# Patient Record
Sex: Male | Born: 1990 | Race: Black or African American | Hispanic: No | Marital: Single | Smoking: Current every day smoker
Health system: Southern US, Community
[De-identification: ages and names within clinical notes are randomized; demographics above are authoritative.]

---
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
## 2011-04-22 ENCOUNTER — Emergency Department: Payer: Self-pay | Admitting: Emergency Medicine

## 2012-12-28 ENCOUNTER — Emergency Department: Payer: Self-pay | Admitting: Emergency Medicine

## 2012-12-28 LAB — CBC
HCT: 42 % (ref 40.0–52.0)
HGB: 14.2 g/dL (ref 13.0–18.0)
MCH: 29.8 pg (ref 26.0–34.0)
MCV: 88 fL (ref 80–100)
Platelet: 211 10*3/uL (ref 150–440)
RBC: 4.76 10*6/uL (ref 4.40–5.90)
WBC: 8 10*3/uL (ref 3.8–10.6)

## 2012-12-28 LAB — BASIC METABOLIC PANEL
Anion Gap: 4 — ABNORMAL LOW (ref 7–16)
BUN: 11 mg/dL (ref 7–18)
Calcium, Total: 9.1 mg/dL (ref 8.5–10.1)
Chloride: 107 mmol/L (ref 98–107)
Co2: 27 mmol/L (ref 21–32)
EGFR (African American): >60
EGFR (Non-African Amer.): >60
Glucose: 105 mg/dL — ABNORMAL HIGH (ref 65–99)
Potassium: 4.1 mmol/L (ref 3.5–5.1)

## 2014-05-19 ENCOUNTER — Emergency Department: Payer: Self-pay | Admitting: Emergency Medicine

## 2014-05-19 LAB — COMPREHENSIVE METABOLIC PANEL
ALBUMIN: 4 g/dL (ref 3.4–5.0)
ALK PHOS: 61 U/L
ALT: 26 U/L
Anion Gap: 6 — ABNORMAL LOW (ref 7–16)
BUN: 9 mg/dL (ref 7–18)
Bilirubin,Total: 0.6 mg/dL (ref 0.2–1.0)
CHLORIDE: 107 mmol/L (ref 98–107)
Calcium, Total: 9 mg/dL (ref 8.5–10.1)
Co2: 26 mmol/L (ref 21–32)
Creatinine: 1.23 mg/dL (ref 0.60–1.30)
EGFR (African American): >60
EGFR (Non-African Amer.): >60
GLUCOSE: 100 mg/dL — AB (ref 65–99)
Osmolality: 276 (ref 275–301)
Potassium: 4.4 mmol/L (ref 3.5–5.1)
SGOT(AST): 24 U/L (ref 15–37)
Sodium: 139 mmol/L (ref 136–145)
TOTAL PROTEIN: 7.8 g/dL (ref 6.4–8.2)

## 2014-05-19 LAB — CBC
HCT: 43.4 % (ref 40.0–52.0)
HGB: 14 g/dL (ref 13.0–18.0)
MCH: 29.7 pg (ref 26.0–34.0)
MCHC: 32.2 g/dL (ref 32.0–36.0)
MCV: 92 fL (ref 80–100)
Platelet: 233 10*3/uL (ref 150–440)
RBC: 4.7 10*6/uL (ref 4.40–5.90)
RDW: 13.4 % (ref 11.5–14.5)
WBC: 14 10*3/uL — ABNORMAL HIGH (ref 3.8–10.6)

## 2014-05-19 LAB — URINALYSIS, COMPLETE
BACTERIA: NONE SEEN
BILIRUBIN, UR: NEGATIVE
Glucose,UR: NEGATIVE mg/dL (ref 0–75)
Leukocyte Esterase: NEGATIVE
Nitrite: NEGATIVE
Ph: 7 (ref 4.5–8.0)
Protein: 30
RBC,UR: 89 /HPF (ref 0–5)
SQUAMOUS EPITHELIAL: NONE SEEN
Specific Gravity: 1.028 (ref 1.003–1.030)

## 2014-08-14 ENCOUNTER — Emergency Department: Payer: Self-pay | Admitting: Emergency Medicine

## 2014-08-21 ENCOUNTER — Emergency Department: Payer: Self-pay | Admitting: Emergency Medicine

## 2014-09-11 ENCOUNTER — Emergency Department
Admit: 2014-09-11 | Disposition: A | Discharge status: Home / Self Care | Payer: Self-pay | Admitting: Emergency Medicine

## 2014-09-27 ENCOUNTER — Emergency Department
Admit: 2014-09-27 | Disposition: A | Discharge status: Home / Self Care | Payer: Self-pay | Admitting: Emergency Medicine

## 2014-10-03 ENCOUNTER — Emergency Department: Admit: 2014-10-03 | Disposition: A | Discharge status: Home / Self Care | Payer: Self-pay | Admitting: Internal Medicine

## 2014-10-16 ENCOUNTER — Emergency Department
Admission: EM | Admit: 2014-10-16 | Discharge: 2014-10-16 | Disposition: A | Discharge status: Home / Self Care | Payer: Self-pay | Attending: Emergency Medicine | Admitting: Emergency Medicine

## 2014-10-16 ENCOUNTER — Emergency Department: Payer: Self-pay

## 2014-10-16 ENCOUNTER — Encounter: Payer: Self-pay | Admitting: Emergency Medicine

## 2014-10-16 DIAGNOSIS — Z720 Tobacco use: Secondary | ICD-10-CM | POA: Insufficient documentation

## 2014-10-16 DIAGNOSIS — R252 Cramp and spasm: Secondary | ICD-10-CM | POA: Insufficient documentation

## 2014-10-16 DIAGNOSIS — R202 Paresthesia of skin: Secondary | ICD-10-CM | POA: Insufficient documentation

## 2014-10-16 MED ORDER — PREDNISONE 10 MG (21) PO TBPK: ORAL_TABLET | ORAL | Status: DC

## 2014-10-16 MED ORDER — CYCLOBENZAPRINE HCL 10 MG PO TABS: 10.0000 mg | ORAL_TABLET | Freq: Three times a day (TID) | ORAL | Status: AC | PRN

## 2014-10-16 MED ORDER — PREDNISONE 20 MG PO TABS: 60.0000 mg | ORAL_TABLET | Freq: Once | ORAL | Status: DC

## 2014-10-16 MED ORDER — TRAMADOL HCL 50 MG PO TABS: 50.0000 mg | ORAL_TABLET | Freq: Four times a day (QID) | ORAL | Status: AC | PRN

## 2014-10-16 MED ORDER — DIAZEPAM 5 MG PO TABS
5.0000 mg | ORAL_TABLET | Freq: Once | ORAL | Status: AC
  Administered 2014-10-16: 5 mg via ORAL

## 2014-10-16 MED ORDER — PREDNISONE 20 MG PO TABS
ORAL_TABLET | ORAL | Status: AC
  Administered 2014-10-16: 60 mg
  Filled 2014-10-16: qty 2

## 2014-10-16 MED ORDER — DIAZEPAM 5 MG PO TABS
ORAL_TABLET | ORAL | Status: AC
  Administered 2014-10-16: 5 mg via ORAL
  Filled 2014-10-16: qty 1

## 2014-10-16 NOTE — ED Provider Notes (Signed)
[facility] Emergency Department Provider Note  ____________________________________________  Time seen: Approximately 1500 I have reviewed the triage vital signs and the nursing notes.   HISTORY  Chief Complaint Shoulder Pain   Historian Patient    HPI James Rodriguez is a 24 y.o. male complaining of multiple months of right shoulder pain with intermittent numbness shooting down his arm as he has not injured himself in any way that he can think of says his pain comes and goes nothing particularly making it better or worse currently rates it as about 8 out of 10 with any type of movement or pushing on the area denies any loss of strength or sensation no other associated signs or symptoms no chest pain shortness of breath headache blurred vision disorientation I anything other previously noted in history of present illness   History reviewed. No pertinent past medical history.    There are no active problems to display for this patient.   History reviewed. No pertinent past surgical history.  Current Outpatient Rx  Name  Route  Sig  Dispense  Refill  . cyclobenzaprine (FLEXERIL) 10 MG tablet   Oral   Take 1 tablet (10 mg total) by mouth every 8 (eight) hours as needed for muscle spasms.   30 tablet   1   . predniSONE (STERAPRED UNI-PAK 21 TAB) 10 MG (21) TBPK tablet      Take 6 tablets on day 1 Take 5 tablets on day 2 Take 4 tablets on day 3 Take 3 tablets on day 4 Take 2 tablets on day 5 Take 1 tablet on day 6   21 tablet   0   . traMADol (ULTRAM) 50 MG tablet   Oral   Take 1 tablet (50 mg total) by mouth every 6 (six) hours as needed for moderate pain.   12 tablet   0     Allergies Review of patient's allergies indicates no known allergies.  No family history on file.  Social History History  Substance Use Topics  . Smoking status: Current Every Day Smoker -- 0.50 packs/day    Types: Cigarettes  . Smokeless tobacco: Not on  file  . Alcohol Use: Yes     Comment: occasionally    Review of Systems Constitutional: No fever.  Baseline level of activity. Eyes: No visual changes.  No red eyes/discharge. ENT: No sore throat.  Not pulling at ears. Cardiovascular: Negative for chest pain/palpitations. Respiratory: Negative for shortness of breath. Gastrointestinal: No abdominal pain.  No nausea, no vomiting.  No diarrhea.  No constipation. Genitourinary: Negative for dysuria.  Normal urination. Musculoskeletal: Negative for back pain. Skin: Negative for rash. Neurological: Negative for headaches, focal weakness or numbness.  10-point ROS otherwise negative.  ____________________________________________   PHYSICAL EXAM:  VITAL SIGNS: ED Triage Vitals  Enc Vitals Group     BP 10/16/14 1206 126/88 mmHg     Pulse Rate 10/16/14 1206 64     Resp 10/16/14 1206 18     Temp 10/16/14 1206 98.1 F (36.7 C)     Temp Source 10/16/14 1206 Oral     SpO2 10/16/14 1206 100 %     Weight 10/16/14 1206 265 lb (120.203 kg)     Height 10/16/14 1206 5\' 11"  (1.803 m)     Head Cir --      Peak Flow --      Pain Score 10/16/14 1208 9     Pain Loc --  Pain Edu? --      Excl. in GC? --     Constitutional: Alert, attentive, and oriented appropriately for age. Well appearing and in no acute distress.  Eyes: Conjunctivae are normal. PERRL. EOMI. Head: Atraumatic and normocephalic. Nose: No congestion/rhinnorhea. Mouth/Throat: Mucous membranes are moist.  Oropharynx non-erythematous. Neck: No stridor. No cervical spine tenderness to palpation. Cardiovascular: Normal rate, regular rhythm. Grossly normal heart sounds.  Good peripheral circulation with normal cap refill. Respiratory: Normal respiratory effort.  No retractions. Lungs CTAB with no W/R/R. Musculoskeletal: Non-tender with normal range of motion in all extremities.  No joint effusions.  Weight-bearing without difficulty. 5 strength equal symmetrical upper  extremities bilaterally no functional deficit full range of motion tight spasm muscles along the trapezius deltoid and latissimus on the right side Neurologic: . No gross focal neurologic deficits are appreciated.  No gait instability. Speech is normal.  Skin:  Skin is warm, dry and intact. No rash noted. Psychiatric: Mood and affect are normal. Speech and behavior are normal.   ____________________________________________    RADIOLOGY  X-rays of the patient C-spine is negative ____________________________________________   PROCEDURES  Procedure(s) performed: None  Critical Care performed: No  ____________________________________________   INITIAL IMPRESSION / ASSESSMENT AND PLAN / ED COURSE  Pertinent labs & imaging results that were available during my care of the patient were reviewed by me and considered in my medical decision making (see chart for details).  She'll impression on this patient right shoulder strain muscle spasm paresthesia start the patient on steroids and muscle relaxers and have the patient follow-up with orthopedics since this is been bothering him for multiple months advised rest ice massage and stretch to the area hernia for any acute concerns or worsening symptoms ____________________________________________   FINAL CLINICAL IMPRESSION(S) / ED DIAGNOSES  Final diagnoses:  Muscle cramps  Paresthesia of right arm     Madicyn Mesina Rosalyn GessWilliam C January Bergthold, PA-C 10/16/14 1611  Sharyn CreamerMark Quale, MD 10/21/14 1700

## 2014-10-16 NOTE — ED Notes (Signed)
Pt c/o right shoulder pain for the past couple of months

## 2014-10-16 NOTE — ED Notes (Signed)
ICE PACK GIVEN

## 2017-02-18 IMAGING — CR DG SHOULDER 3+V*R*
1 series · 3 of 3 positions shown · non-contrast
Comparison: None.

CLINICAL DATA: Right shoulder pain for 4 months, no known injury,
initial encounter.

EXAM:
DG SHOULDER 3+ VIEWS RIGHT

[Series 1: dxr shoulder right complete · 0.14mm/px · 3 of 3 slices shown]
[im 1/3]
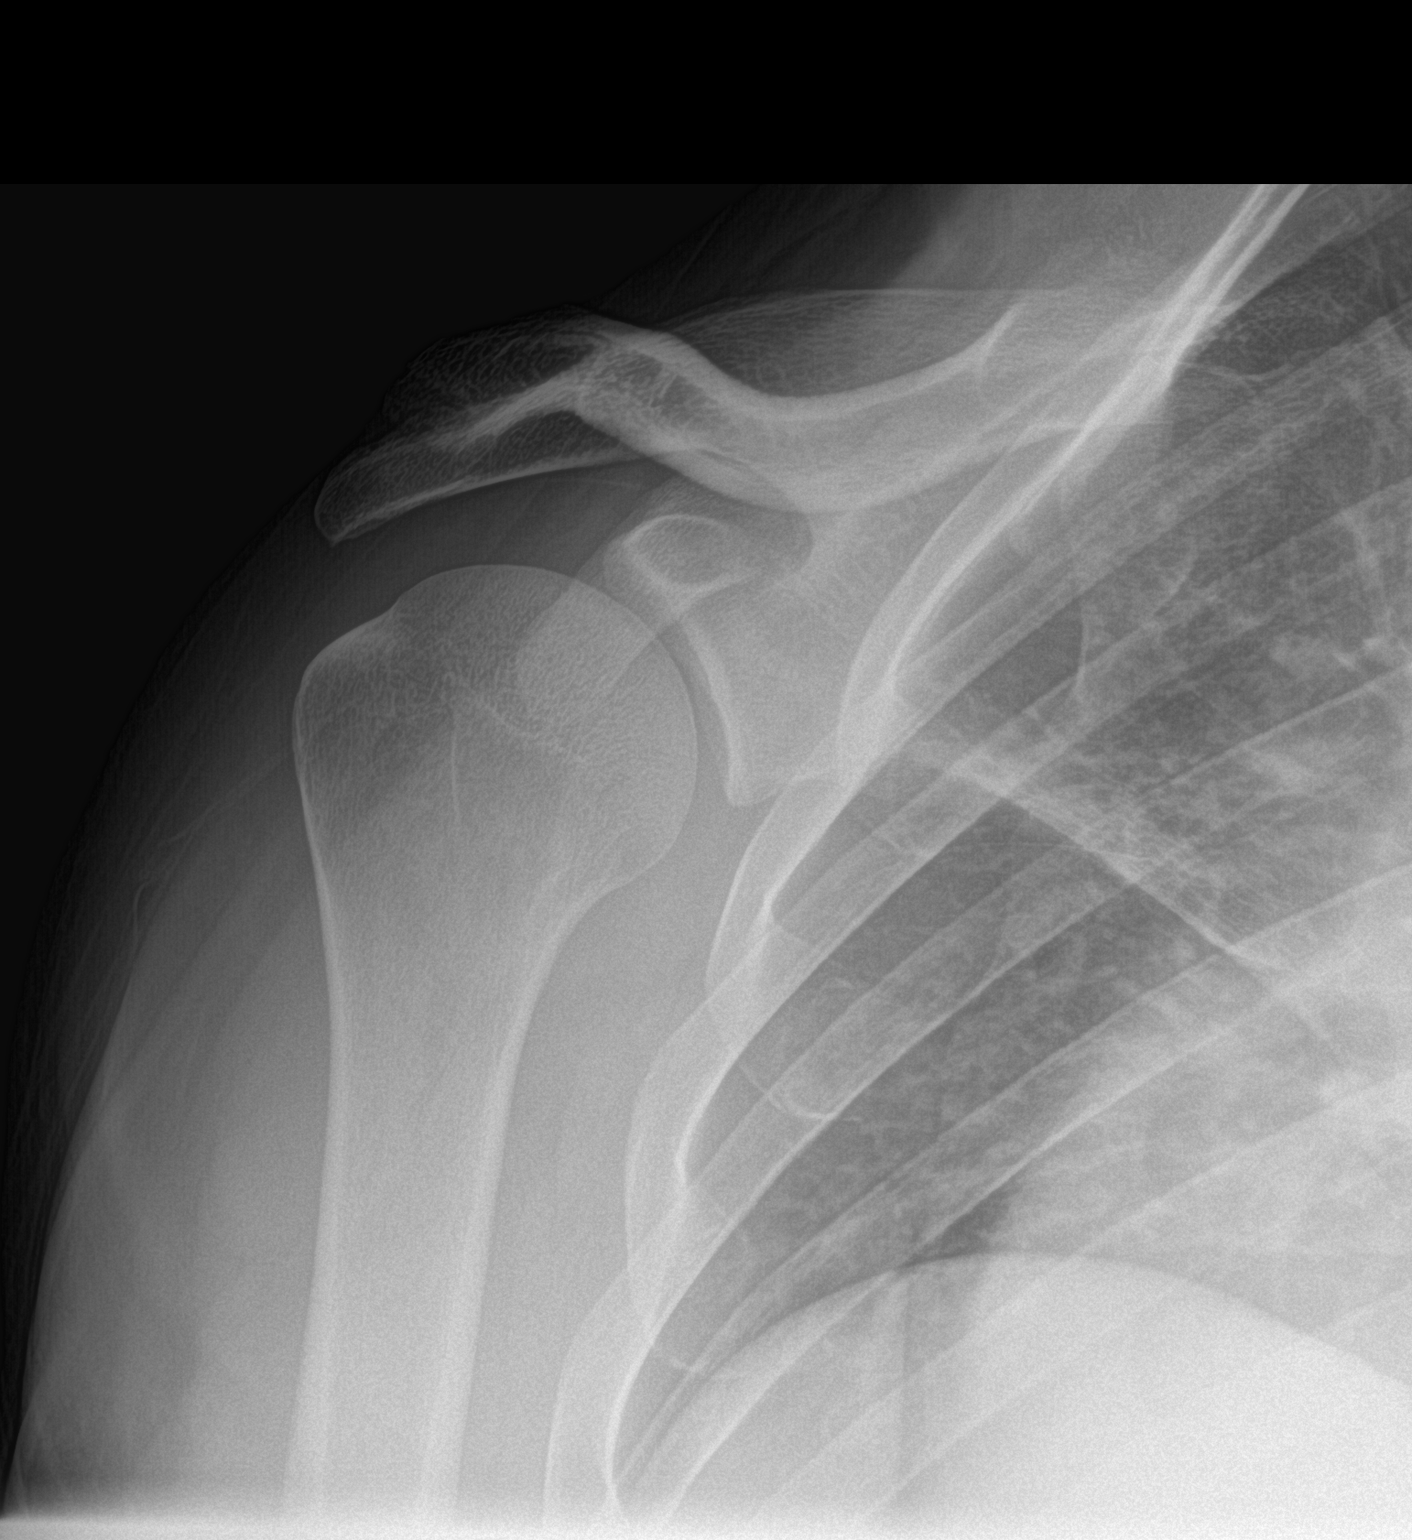
[im 2/3]
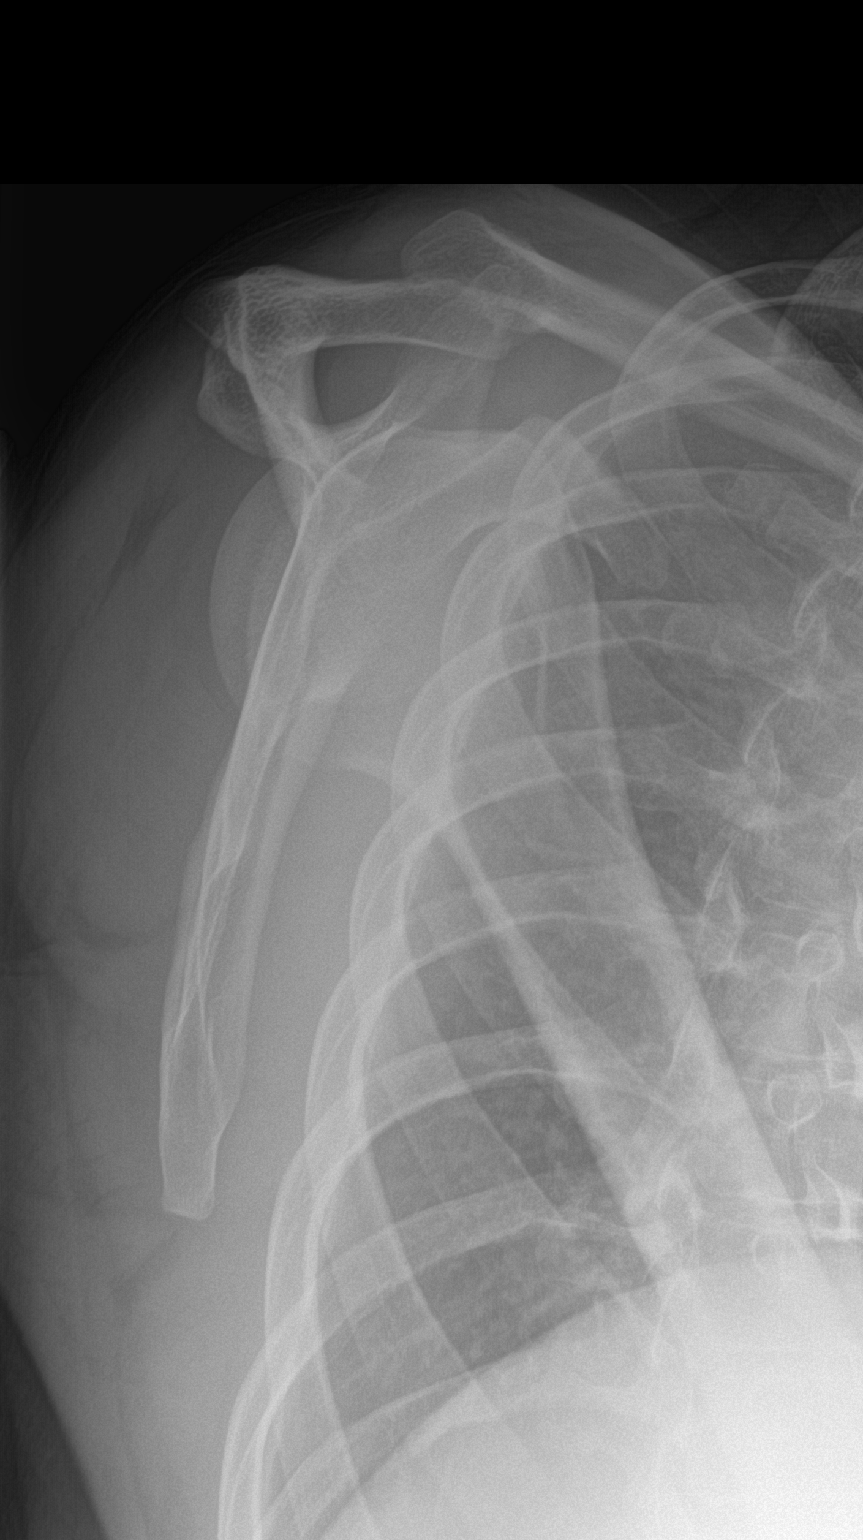
[im 3/3]
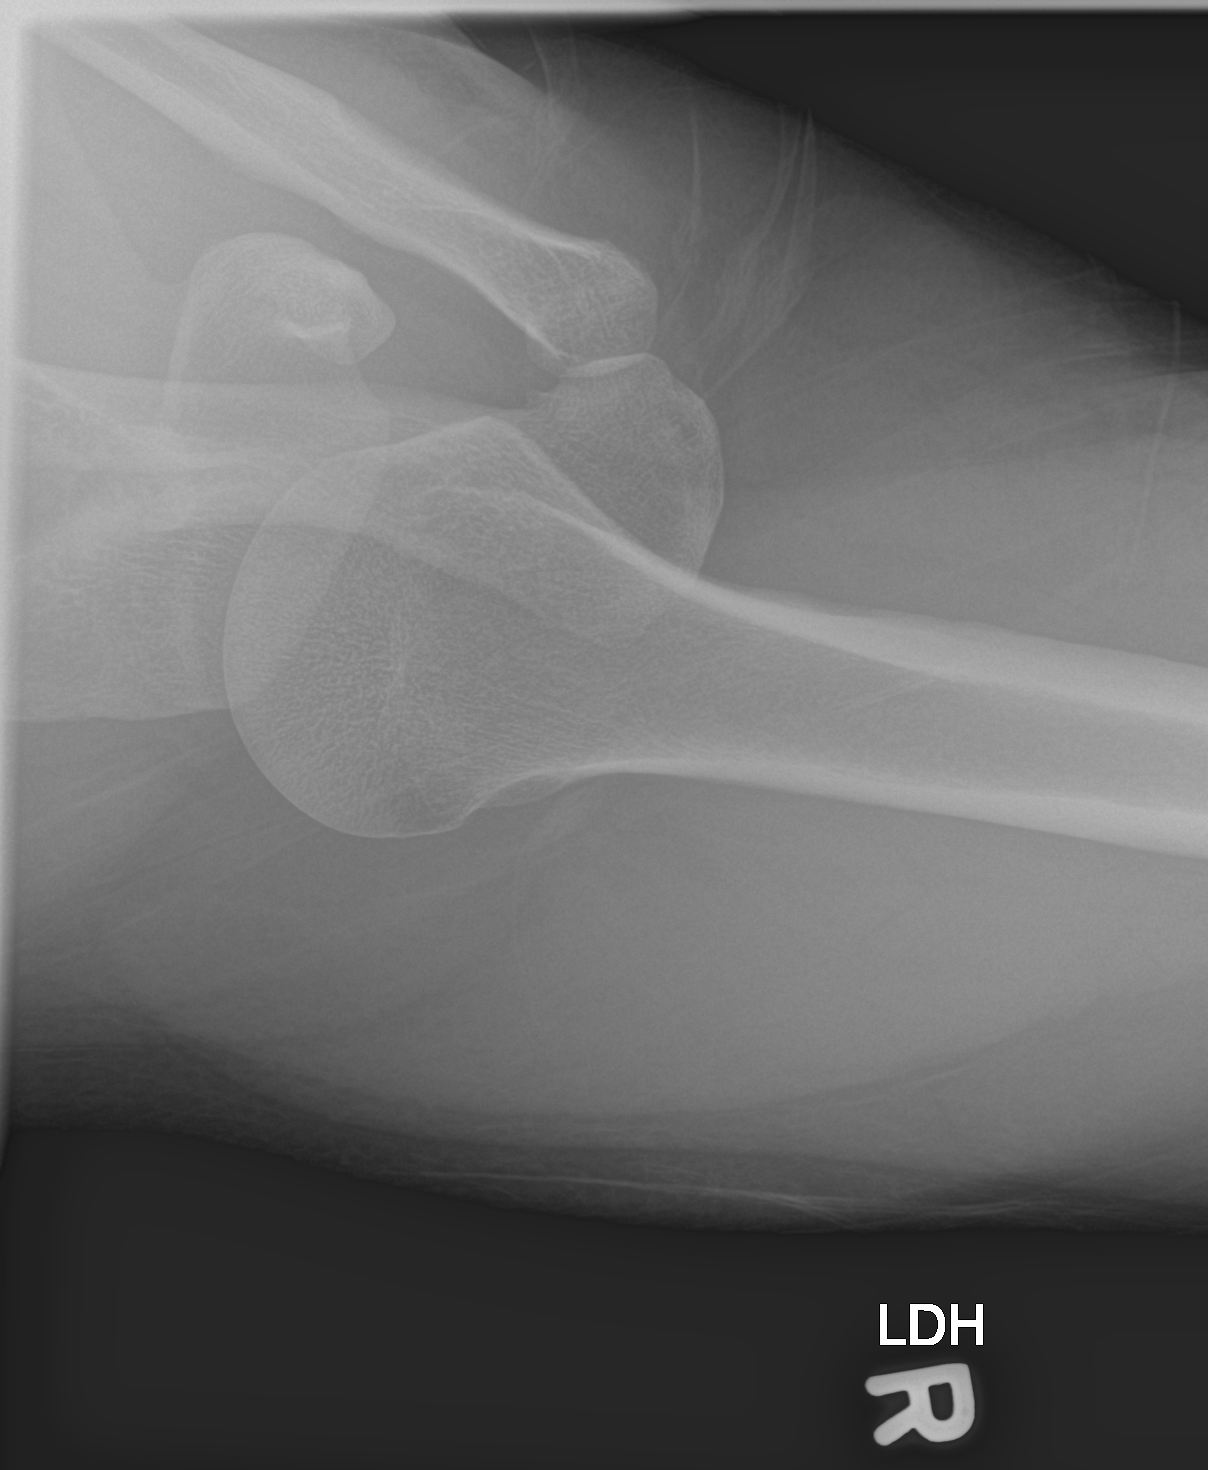

[3 of 3 positions shown; findings below may reference images not displayed]

FINDINGS: No acute osseous or joint abnormality. No degenerative changes.
Visualized portion of the right chest is unremarkable.
IMPRESSION: Negative.

## 2018-03-23 ENCOUNTER — Emergency Department
Admission: EM | Admit: 2018-03-23 | Discharge: 2018-03-23 | Disposition: A | Discharge status: Home / Self Care | Payer: Self-pay | Attending: Emergency Medicine | Admitting: Emergency Medicine

## 2018-03-23 ENCOUNTER — Other Ambulatory Visit: Payer: Self-pay

## 2018-03-23 ENCOUNTER — Encounter: Payer: Self-pay | Admitting: Emergency Medicine

## 2018-03-23 DIAGNOSIS — R51 Headache: Secondary | ICD-10-CM | POA: Insufficient documentation

## 2018-03-23 DIAGNOSIS — F17210 Nicotine dependence, cigarettes, uncomplicated: Secondary | ICD-10-CM | POA: Insufficient documentation

## 2018-03-23 DIAGNOSIS — R519 Headache, unspecified: Secondary | ICD-10-CM

## 2018-03-23 DIAGNOSIS — K029 Dental caries, unspecified: Secondary | ICD-10-CM | POA: Insufficient documentation

## 2018-03-23 MED ORDER — AMOXICILLIN 500 MG PO CAPS: 500.0000 mg | ORAL_CAPSULE | Freq: Three times a day (TID) | ORAL | 0 refills | Status: DC

## 2018-03-23 MED ORDER — IBUPROFEN 600 MG PO TABS: 600.0000 mg | ORAL_TABLET | Freq: Four times a day (QID) | ORAL | 0 refills | Status: DC | PRN

## 2018-03-23 MED ORDER — LIDOCAINE VISCOUS HCL 2 % MT SOLN: 10.0000 mL | OROMUCOSAL | 0 refills | Status: DC | PRN

## 2018-03-23 NOTE — ED Notes (Signed)
See triage note  Present with dental pain  Possible dental abscess   States he has had dental pain for about 2months

## 2018-03-23 NOTE — ED Triage Notes (Signed)
PT to ED , c/o dental pain x33months and states causing migraine. Pt A&OX4, NAD noted. Appears uncomfortable

## 2018-03-23 NOTE — Discharge Instructions (Addendum)
OPTIONS FOR DENTAL FOLLOW UP CARE ° °[place] [facility] and [facility] - Local Safety Net Dental Clinics °http://www.ncdhhs.gov/dph/oralhealth/services/safetynetclinics.htm °  °[facility] (336-562-3123) ° °[facility] [place] ([phone]) ° °[facility] ([phone] ext [phone]) ° °[facility] ([phone]) ° °[facility] ([phone]) °This clinic caters to the indigent population and is on a lottery system. °Location: °[facility], [address], [place] °Clinic Hours: °Wednesdays from 6pm - 9pm, patients seen by a lottery system. °For dates, call or go to www.med.[facility].edu/[facility]/patients/Dental-[facility] °Services: °Cleanings, fillings and simple extractions. °Payment Options: °DENTAL WORK IS FREE OF CHARGE. Bring proof of income or support. °Best way to get seen: °Arrive at 5:15 pm - this is a lottery, NOT first come/first serve, so arriving earlier will not increase your chances of being seen. °  °  °[facility] °[phone] °Select option 1 for emergencies °  °Location: °[facility], [address], [place] °Clinic Hours: °No walk-ins accepted - call the day before to schedule an appointment. °Check in times are 9:30 am and 1:30 pm. °Services: °Simple extractions, temporary fillings, pulpectomy/pulp debridement, uncomplicated abscess drainage. °Payment Options: °PAYMENT IS DUE AT THE TIME OF SERVICE.  Fee is usually $100-200, additional surgical procedures (e.g. abscess drainage) may be extra. °Cash, checks, Visa/MasterCard accepted.  Can file Medicaid if patient is covered for dental - patient should call case worker to check. °No discount for [facility] patients. °Best way to get seen: °MUST call the day before and get onto the schedule. Can usually be seen the next 1-2 days. No walk-ins accepted. °  °  °[facility] °[phone] °   °Location: °[facility], [address], [place] °Clinic Hours: °M, W, Th, F 8am or 1:30pm, Tues 9a or 1:30 - first come/first served. °Services: °Simple extractions, temporary fillings, uncomplicated abscess drainage.  You do not need to be an [place] resident. °Payment Options: °PAYMENT IS DUE AT THE TIME OF SERVICE. °Dental insurance, otherwise sliding scale - bring proof of income or support. °Depending on income and treatment needed, cost is usually $50-200. °Best way to get seen: °Arrive early as it is first come/first served. °  °  °[facility] °[phone] °  °Location: °[address] °Clinic Hours: °Mon-Thu 8a-5p °Services: °Most basic dental services including extractions and fillings. °Payment Options: °PAYMENT IS DUE AT THE TIME OF SERVICE. °Sliding scale, up to 50% off - bring proof if income or support. °Medicaid with dental option accepted. °Best way to get seen: °Call to schedule an appointment, can usually be seen within 2 weeks OR they will try to see walk-ins - show up at 8a or 2p (you may have to wait). °  °  °[facility] °[phone] °[place] RESIDENTS ONLY °  °Location: °[facility], [address], [place], [place] [address] °Clinic Hours: By appointment only. °Monday - Thursday 8am-5pm, Friday 8am-12pm °Services: Cleanings, fillings, extractions. °Payment Options: °PAYMENT IS DUE AT THE TIME OF SERVICE. °Cash, Visa or MasterCard. Sliding scale - $30 minimum per service. °Best way to get seen: °Come in to office, complete packet and make an appointment - need proof of income °or support monies for each household member and proof of [place] residence. °Usually takes about a month to get in. °  °  °[facility] °[phone] °  °Location: °[address]   [place] °Clinic Hours: Walk-in Urgent Care Dental Services are offered Monday-Friday  mornings only. °The numbers of emergencies accepted daily is limited to the number of °providers available. °Maximum 15 - Mondays, Wednesdays & Thursdays °Maximum 10 - Tuesdays & Fridays °Services: °You do not need to be a [place] resident to be seen for a dental emergency. °Emergencies are defined as pain, swelling, abnormal bleeding, or dental trauma. Walkins will receive x-rays if needed. °NOTE: Dental cleaning is not an emergency. °Payment Options: °PAYMENT IS DUE AT THE TIME OF SERVICE. °Minimum co-pay is $40.00 for uninsured patients. °Minimum co-pay is $3.00 for Medicaid with dental coverage. °Dental Insurance is accepted and must be presented at time of visit. °Medicare does not cover dental. °Forms of payment: Cash, credit card, checks. °Best way to get seen: °If not previously registered with the clinic, walk-in dental registration begins at 7:15 am and is on a first come/first serve basis. °If previously registered with the clinic, call to make an appointment. °  °  °[facility] °[phone] °[place] RESIDENTS ONLY °  °Location: °[address], [place], [place] °Clinic Hours: °Mon-Thu 10a-2p °Services: Extractions only! °Payment Options: °FREE (donations accepted) - bring proof of income or support °Best way to get seen: °Call and schedule an appointment OR come at 8am on the 1st Monday of every month (except for holidays) when it is first come/first served. °  °  °[facility] °[phone] °  °Location: °[address], [place] °Clinic Hours: °Friday mornings °Services, Payment Options, Best way to get seen: °Call for info °

## 2018-03-23 NOTE — ED Provider Notes (Signed)
[facility] Emergency Department Provider Note  ____________________________________________  Time seen: Approximately 7:42 AM  I have reviewed the triage vital signs and the nursing notes.   HISTORY  Chief Complaint Dental Pain and Headache    HPI James Rodriguez is a 27 y.o. male that presents emergency department for evaluation of right-sided dental pain and headache for 2 months.  Patient states that headache worsens when dental pain worsens and improves when dental pain improves.  Headache is primarily on the right side.  Headache resolved with ibuprofen.  Headache has not changed in character in the last 2 months.  Patient woke up this morning with worsening dental pain so he came to the emergency department.  He does not have a dentist and has not seen a dentist in years.  No fever, chills, nausea, vomiting.  History reviewed. No pertinent past medical history.  There are no active problems to display for this patient.   History reviewed. No pertinent surgical history.  Prior to Admission medications   Medication Sig Start Date End Date Taking? Authorizing Provider  amoxicillin (AMOXIL) 500 MG capsule Take 1 capsule (500 mg total) by mouth 3 (three) times daily. 03/23/18   Enid Derry, PA-C  ibuprofen (ADVIL,MOTRIN) 600 MG tablet Take 1 tablet (600 mg total) by mouth every 6 (six) hours as needed. 03/23/18   Enid Derry, PA-C  lidocaine (XYLOCAINE) 2 % solution Use as directed 10 mLs in the mouth or throat as needed for mouth pain. 03/23/18   Enid Derry, PA-C  predniSONE (STERAPRED UNI-PAK 21 TAB) 10 MG (21) TBPK tablet Take 6 tablets on day 1 Take 5 tablets on day 2 Take 4 tablets on day 3 Take 3 tablets on day 4 Take 2 tablets on day 5 Take 1 tablet on day 6 10/16/14   Ruffian, III Kristine Garbe, PA-C    Allergies Patient has no known allergies.  No family history on file.  Social History Social History   Tobacco Use  . Smoking  status: Current Every Day Smoker    Packs/day: 0.50    Types: Cigarettes  . Smokeless tobacco: Never Used  Substance Use Topics  . Alcohol use: Yes    Comment: occasionally  . Drug use: No     Review of Systems  Constitutional: No fever/chills Cardiovascular: No chest pain. Respiratory: No SOB. Gastrointestinal: No abdominal pain.  No nausea, no vomiting.  Musculoskeletal: Negative for musculoskeletal pain. Skin: Negative for rash, abrasions, lacerations, ecchymosis. Neurological: Negative for numbness or tingling.  Positive for headache.   ____________________________________________   PHYSICAL EXAM:  VITAL SIGNS: ED Triage Vitals  Enc Vitals Group     BP 03/23/18 0709 (!) 160/90     Pulse Rate 03/23/18 0709 67     Resp 03/23/18 0709 16     Temp 03/23/18 0709 98.8 F (37.1 C)     Temp Source 03/23/18 0709 Oral     SpO2 03/23/18 0709 98 %     Weight --      Height --      Head Circumference --      Peak Flow --      Pain Score 03/23/18 0708 10     Pain Loc --      Pain Edu? --      Excl. in GC? --      Constitutional: Alert and oriented. Well appearing and in no acute distress. Eyes: Conjunctivae are normal. PERRL. EOMI. Head: Atraumatic. ENT:  Ears:       Nose: No congestion/rhinnorhea.      Mouth/Throat: Mucous membranes are moist.  Right dental premolar fracture.  Tenderness to palpation surrounding right bottom back molar.  No swelling.  No drainage.  No difficulty opening or closing mouth. Neck: No stridor.   Cardiovascular: Normal rate, regular rhythm.  Good peripheral circulation. Respiratory: Normal respiratory effort without tachypnea or retractions. Lungs CTAB. Good air entry to the bases with no decreased or absent breath sounds. Musculoskeletal: Full range of motion to all extremities. No gross deformities appreciated. Neurologic:  Normal speech and language. No gross focal neurologic deficits are appreciated.  Skin:  Skin is warm, dry and  intact. No rash noted. Psychiatric: Mood and affect are normal. Speech and behavior are normal. Patient exhibits appropriate insight and judgement.   ____________________________________________   LABS (all labs ordered are listed, but only abnormal results are displayed)  Labs Reviewed - No data to display ____________________________________________  EKG   ____________________________________________  RADIOLOGY  No results found.  ____________________________________________    PROCEDURES  Procedure(s) performed:    Procedures    Medications - No data to display   ____________________________________________   INITIAL IMPRESSION / ASSESSMENT AND PLAN / ED COURSE  Pertinent labs & imaging results that were available during my care of the patient were reviewed by me and considered in my medical decision making (see chart for details).  Review of the [place] CSRS was performed in accordance of the [facility] prior to dispensing any controlled drugs.     Patient presented to the department for evaluation of dental pain.  Vital signs and exam are reassuring.  Patient states that headache is directly related to dental pain.  Headache has not changed in character today and resolves with ibuprofen.  We discussed additional work-up and patient is agreeable to begin antibiotics and follow-up with dentist or return to the emergency department for worsening or changing symptoms.  Patient will be discharged home with prescriptions for amoxicillin, viscous lidocaine, ibuprofen. Patient is to follow up with dentist as directed.  Dental resources were given.  Patient is given ED precautions to return to the ED for any worsening or new symptoms.     ____________________________________________  FINAL CLINICAL IMPRESSION(S) / ED DIAGNOSES  Final diagnoses:  Pain due to dental caries  Acute intractable headache, unspecified headache type      NEW MEDICATIONS STARTED DURING THIS  VISIT:  ED Discharge Orders         Ordered    amoxicillin (AMOXIL) 500 MG capsule  3 times daily     03/23/18 0740    ibuprofen (ADVIL,MOTRIN) 600 MG tablet  Every 6 hours PRN     03/23/18 0740    lidocaine (XYLOCAINE) 2 % solution  As needed     03/23/18 0740              This chart was dictated using voice recognition software/Dragon. Despite best efforts to proofread, errors can occur which can change the meaning. Any change was purely unintentional.    Enid Derry, PA-C 03/23/18 1502    Emily Filbert, MD 03/24/18 [phone]

## 2020-02-18 ENCOUNTER — Emergency Department: Payer: Self-pay

## 2020-02-18 ENCOUNTER — Other Ambulatory Visit: Payer: Self-pay

## 2020-02-18 ENCOUNTER — Emergency Department
Admission: EM | Admit: 2020-02-18 | Discharge: 2020-02-18 | Disposition: A | Discharge status: Home / Self Care | Payer: Self-pay | Attending: Emergency Medicine | Admitting: Emergency Medicine

## 2020-02-18 DIAGNOSIS — T07XXXA Unspecified multiple injuries, initial encounter: Secondary | ICD-10-CM

## 2020-02-18 DIAGNOSIS — Z23 Encounter for immunization: Secondary | ICD-10-CM | POA: Insufficient documentation

## 2020-02-18 DIAGNOSIS — Z79899 Other long term (current) drug therapy: Secondary | ICD-10-CM | POA: Insufficient documentation

## 2020-02-18 DIAGNOSIS — Y929 Unspecified place or not applicable: Secondary | ICD-10-CM | POA: Insufficient documentation

## 2020-02-18 DIAGNOSIS — S61412A Laceration without foreign body of left hand, initial encounter: Secondary | ICD-10-CM

## 2020-02-18 DIAGNOSIS — F17210 Nicotine dependence, cigarettes, uncomplicated: Secondary | ICD-10-CM | POA: Insufficient documentation

## 2020-02-18 DIAGNOSIS — S61432A Puncture wound without foreign body of left hand, initial encounter: Secondary | ICD-10-CM | POA: Insufficient documentation

## 2020-02-18 DIAGNOSIS — Y939 Activity, unspecified: Secondary | ICD-10-CM | POA: Insufficient documentation

## 2020-02-18 DIAGNOSIS — Y999 Unspecified external cause status: Secondary | ICD-10-CM | POA: Insufficient documentation

## 2020-02-18 MED ORDER — LIDOCAINE HCL (PF) 1 % IJ SOLN: 30.0000 mL | Freq: Once | INTRAMUSCULAR | Status: DC

## 2020-02-18 MED ORDER — TETANUS-DIPHTH-ACELL PERTUSSIS 5-2.5-18.5 LF-MCG/0.5 IM SUSP
0.5000 mL | Freq: Once | INTRAMUSCULAR | Status: AC
  Administered 2020-02-18: 0.5 mL via INTRAMUSCULAR
  Filled 2020-02-18: qty 0.5

## 2020-02-18 MED ORDER — CEPHALEXIN 500 MG PO CAPS
500.0000 mg | ORAL_CAPSULE | Freq: Once | ORAL | Status: AC
Start: 2020-02-18 — End: 2020-02-18
  Administered 2020-02-18: 500 mg via ORAL
  Filled 2020-02-18: qty 1

## 2020-02-18 MED ORDER — LIDOCAINE HCL (PF) 1 % IJ SOLN
INTRAMUSCULAR | Status: AC
  Filled 2020-02-18: qty 10

## 2020-02-18 MED ORDER — CEPHALEXIN 500 MG PO CAPS: 500.0000 mg | ORAL_CAPSULE | Freq: Four times a day (QID) | ORAL | 0 refills | Status: AC
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
## 2020-09-04 ENCOUNTER — Emergency Department
Admission: EM | Admit: 2020-09-04 | Discharge: 2020-09-04 | Disposition: A | Discharge status: Home / Self Care | Payer: Self-pay | Attending: Emergency Medicine | Admitting: Emergency Medicine

## 2020-09-04 ENCOUNTER — Encounter: Payer: Self-pay | Admitting: Emergency Medicine

## 2020-09-04 ENCOUNTER — Other Ambulatory Visit: Payer: Self-pay

## 2020-09-04 ENCOUNTER — Emergency Department: Payer: Self-pay

## 2020-09-04 DIAGNOSIS — X500XXA Overexertion from strenuous movement or load, initial encounter: Secondary | ICD-10-CM | POA: Insufficient documentation

## 2020-09-04 DIAGNOSIS — M4722 Other spondylosis with radiculopathy, cervical region: Secondary | ICD-10-CM | POA: Insufficient documentation

## 2020-09-04 DIAGNOSIS — F17210 Nicotine dependence, cigarettes, uncomplicated: Secondary | ICD-10-CM | POA: Insufficient documentation

## 2020-09-04 MED ORDER — PREDNISONE 10 MG PO TABS: ORAL_TABLET | ORAL | 0 refills | Status: DC

## 2020-09-04 MED ORDER — KETOROLAC TROMETHAMINE 30 MG/ML IJ SOLN
30.0000 mg | Freq: Once | INTRAMUSCULAR | Status: AC
  Administered 2020-09-04: 30 mg via INTRAMUSCULAR
  Filled 2020-09-04: qty 1

## 2020-09-04 NOTE — ED Triage Notes (Signed)
Pt c/o pain to L shoulder, pt states pain starts in L hand and then radiates up through L arm to his neck. Pt states pain has been ongoing for last 15 days. 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 Pt states pain localized to L 3rd digit at this time then radiates up his L arm. States pain starts throbbing then is numb, states numbness is straight line from middle finger up his shoulder. Pt states pain worse with movement or with lifting.

## 2020-09-04 NOTE — Discharge Instructions (Signed)
Follow-up with your primary care provider if any continued problems and also if a referral for physical therapy is needed.  A prescription for prednisone was sent to your pharmacy.  You may take Tylenol with this medication if additional pain medication is needed.  Use ice or heat to your neck as needed for discomfort.  Also have your blood pressure rechecked at your primary care provider as it was elevated while in the emergency department today.

## 2020-09-04 NOTE — ED Notes (Signed)
See triage note  Presents with left shoulder pain  Denies any injury  States pain goes from neck at times into left shoulder and arm  No deformity noted   Good pulses

## 2020-09-04 NOTE — ED Provider Notes (Signed)
[facility] Emergency Department Provider Note  ____________________________________________   Event Date/Time   First MD Initiated Contact with Patient 09/04/20 [phone]     (approximate)  I have reviewed the triage vital signs and the nursing notes.   HISTORY  Chief Complaint Shoulder Pain   HPI James Rodriguez is a 30 y.o. male Modena Jansky to the ED with complaint of left shoulder pain that radiates down into his left hand.  Patient states that there was no specific injury but that he lifts a lot of heavy boxes.  He also states that there is pain/numbness in his left third digit at times.  He has taken over-the-counter ibuprofen without any relief.  Patient denies any previous injury to his neck.  Currently rates his pain as an 8 out of 10.       History reviewed. No pertinent past medical history.  There are no problems to display for this patient.   History reviewed. No pertinent surgical history.  Prior to Admission medications   Medication Sig Start Date End Date Taking? Authorizing Provider  predniSONE (DELTASONE) 10 MG tablet Take 6 tablets  today, on day 2 take 5 tablets, day 3 take 4 tablets, day 4 take 3 tablets, day 5 take  2 tablets and 1 tablet the last day 09/04/20  Yes Tommi Rumps, PA-C    Allergies Patient has no known allergies.  History reviewed. No pertinent family history.  Social History Social History   Tobacco Use  . Smoking status: Current Every Day Smoker    Packs/day: 0.50    Types: Cigarettes  . Smokeless tobacco: Never Used  Substance Use Topics  . Alcohol use: Yes    Comment: occasionally  . Drug use: No    Review of Systems Constitutional: No fever/chills Eyes: No visual changes. ENT: No sore throat. Cardiovascular: Denies chest pain. Respiratory: Denies shortness of breath. Gastrointestinal: No abdominal pain.  No nausea, no vomiting.  Musculoskeletal: Positive for cervical and left shoulder  pain. Skin: Negative for rash. Neurological: Negative for headaches and focal weakness.  Positive for radicular type issues down the left arm.  ____________________________________________   PHYSICAL EXAM:  VITAL SIGNS: ED Triage Vitals  Enc Vitals Group     BP 09/04/20 0840 (!) 147/97     Pulse Rate 09/04/20 0840 61     Resp 09/04/20 0840 18     Temp 09/04/20 0840 98.8 F (37.1 C)     Temp Source 09/04/20 0840 Oral     SpO2 09/04/20 0840 100 %     Weight 09/04/20 0840 230 lb (104.3 kg)     Height 09/04/20 0840 5\' 11"  (1.803 m)     Head Circumference --      Peak Flow --      Pain Score 09/04/20 0847 8     Pain Loc --      Pain Edu? --      Excl. in GC? --     Constitutional: Alert and oriented. Well appearing and in no acute distress. Eyes: Conjunctivae are normal. PERRL. EOMI. Head: Atraumatic. Nose: No congestion/rhinnorhea. Neck: No stridor.  Minimal tenderness is noted on palpation of approximately C4-C5 and C5-C6.  No gross deformity is noted and no soft tissue edema or discoloration is present.  Range of motion is unrestricted. Cardiovascular: Normal rate, regular rhythm. Grossly normal heart sounds.  Good peripheral circulation. Respiratory: Normal respiratory effort.  No retractions. Lungs CTAB. Musculoskeletal: Patient is able move upper and  lower extremities.  There is some minimal tenderness on palpation of the left trapezius muscle.  No crepitus with range of motion of the left shoulder.  Good muscle strength bilaterally.  No gross deformity noted. Neurologic:  Normal speech and language. No gross focal neurologic deficits are appreciated. No gait instability. Skin:  Skin is warm, dry and intact. No rash noted. Psychiatric: Mood and affect are normal. Speech and behavior are normal.  ____________________________________________   LABS (all labs ordered are listed, but only abnormal results are displayed)  Labs Reviewed - No data to  display ____________________________________________   RADIOLOGY I, Tommi Rumps, personally viewed and evaluated these images (plain radiographs) as part of my medical decision making, as well as reviewing the written report by the radiologist.   Official radiology report(s): DG Cervical Spine 2-3 Views  Result Date: 09/04/2020 CLINICAL DATA:  Patent left upper extremity with radiculopathy. Neck pain. Left hand swelling. EXAM: CERVICAL SPINE - 2-3 VIEW COMPARISON:  10/16/2014 FINDINGS: Grade 1 anterolisthesis of C3 on C4. Alignment otherwise within normal limits. There is mild disc height loss and endplate spurring at C4-C5, C5-C6, C6-C7. Facet degenerative changes seen throughout the lower cervical spine. Visualized lung apices are clear. Prevertebral soft tissues normal thickness. IMPRESSION: Mild multilevel degenerative changes of the cervical spine which has progressed since prior exam from 2016. Electronically Signed   By: Acquanetta Belling M.D.   On: 09/04/2020 10:21    ____________________________________________   PROCEDURES  Procedure(s) performed (including Critical Care):  Procedures   ____________________________________________   INITIAL IMPRESSION / ASSESSMENT AND PLAN / ED COURSE  As part of my medical decision making, I reviewed the following data within the electronic MEDICAL RECORD NUMBER Notes from prior ED visits and [place] Controlled Substance Database  30 year old male presents to the ED with complaint of left shoulder pain with radiculopathy down to his left third digit.  He denies any injury to his neck both recently or in the past.  He states he has had these symptoms for approximately 2 weeks and no over-the-counter medications are helping thus far.  On physical exam there is some minimal tenderness on palpation of C for C5 and C5-C6 area.  Good muscle strength bilaterally.  Cervical spine x-ray shows degenerative changes with spurring at multiple levels.  Patient was  given Toradol 30 mg IM prior to his x-rays.  At the time of discharge patient states that his pain is almost completely gone and that he has no radiculopathy into his fingers or hands.  Patient is to follow-up with his PCP if any continued problems.  He was discharged with a prescription for prednisone 6-day taper starting with 60 mg. ____________________________________________   FINAL CLINICAL IMPRESSION(S) / ED DIAGNOSES  Final diagnoses:  Osteoarthritis of spine with radiculopathy, cervical region     ED Discharge Orders         Ordered    predniSONE (DELTASONE) 10 MG tablet        09/04/20 1040          *Please note:  James Rodriguez was evaluated in Emergency Department on 09/04/2020 for the symptoms described in the history of present illness. He was evaluated in the context of the global COVID-19 pandemic, which necessitated consideration that the patient might be at risk for infection with the SARS-CoV-2 virus that causes COVID-19. Institutional protocols and algorithms that pertain to the evaluation of patients at risk for COVID-19 are in a state of rapid change based on information released  by regulatory bodies including the CDC and federal and state organizations. These policies and algorithms were followed during the patient's care in the ED.  Some ED evaluations and interventions may be delayed as a result of limited staffing during and the pandemic.*   Note:  This document was prepared using Dragon voice recognition software and may include unintentional dictation errors.    Tommi Rumps, PA-C 09/04/20 1318    Chesley Noon, MD 09/04/20 [phone]

## 2020-10-05 ENCOUNTER — Encounter: Payer: Self-pay | Admitting: Emergency Medicine

## 2020-10-05 ENCOUNTER — Telehealth: Payer: Self-pay | Admitting: Emergency Medicine

## 2020-10-05 ENCOUNTER — Emergency Department: Payer: Self-pay

## 2020-10-05 ENCOUNTER — Other Ambulatory Visit: Payer: Self-pay

## 2020-10-05 ENCOUNTER — Emergency Department
Admission: EM | Admit: 2020-10-05 | Discharge: 2020-10-05 | Disposition: A | Discharge status: Home / Self Care | Payer: Self-pay | Attending: Emergency Medicine | Admitting: Emergency Medicine

## 2020-10-05 DIAGNOSIS — M5412 Radiculopathy, cervical region: Secondary | ICD-10-CM | POA: Insufficient documentation

## 2020-10-05 DIAGNOSIS — F17210 Nicotine dependence, cigarettes, uncomplicated: Secondary | ICD-10-CM | POA: Insufficient documentation

## 2020-10-05 DIAGNOSIS — R030 Elevated blood-pressure reading, without diagnosis of hypertension: Secondary | ICD-10-CM | POA: Insufficient documentation

## 2020-10-05 DIAGNOSIS — R200 Anesthesia of skin: Secondary | ICD-10-CM | POA: Insufficient documentation

## 2020-10-05 MED ORDER — KETOROLAC TROMETHAMINE 30 MG/ML IJ SOLN
30.0000 mg | Freq: Once | INTRAMUSCULAR | Status: AC
  Administered 2020-10-05: 30 mg via INTRAMUSCULAR
  Filled 2020-10-05: qty 1

## 2020-10-05 MED ORDER — OXYCODONE-ACETAMINOPHEN 7.5-325 MG PO TABS: 1.0000 | ORAL_TABLET | Freq: Four times a day (QID) | ORAL | 0 refills | Status: DC | PRN

## 2020-10-05 MED ORDER — PREDNISONE 10 MG (48) PO TBPK: ORAL_TABLET | ORAL | 0 refills | Status: DC

## 2020-10-05 MED ORDER — OXYCODONE-ACETAMINOPHEN 5-325 MG PO TABS
1.0000 | ORAL_TABLET | Freq: Once | ORAL | Status: AC
  Administered 2020-10-05: 1 via ORAL
  Filled 2020-10-05: qty 1

## 2020-10-05 NOTE — ED Notes (Signed)
See triage note  Presents with pain from right side of neck into right shoulder   States sx's started about 2 months ago  Denies any fall but does lifting at work  No deformity noted

## 2020-10-05 NOTE — ED Triage Notes (Signed)
Pt to ED via POV stating that he has been having pain in his neck and right shoulder x 2 months. Pt states that his is also having numbness in his right index finger. Pt states that when he was seen last month he was given prednisone but it did not help. Pt states that he is also having some swelling in his right hand. Pt states that being in the hot shower does help with the pain but as soon as he gets out the pain starts back.

## 2020-10-05 NOTE — ED Provider Notes (Signed)
[facility] Emergency Department Provider Note   ____________________________________________   Event Date/Time   First MD Initiated Contact with Patient 10/05/20 [phone]     (approximate)  I have reviewed the triage vital signs and the nursing notes.   HISTORY  Chief Complaint Neck Pain and Shoulder Pain   HPI James Rodriguez is a 30 y.o. male presents to the ED with complaint of right-sided neck pain that radiates into his right shoulder.  Patient states that symptoms started approximately 2 months ago.  He denies any injury prior to his symptoms.  Patient also reports some numbness in his right index finger.  He was seen last month and given a prescription for prednisone which he states did not help.  Patient did not follow-up with his PCP.  Currently rates his pain as an 8 out of 10.      History reviewed. No pertinent past medical history.  There are no problems to display for this patient.   History reviewed. No pertinent surgical history.  Prior to Admission medications   Medication Sig Start Date End Date Taking? Authorizing Provider  oxyCODONE-acetaminophen (PERCOCET) 7.5-325 MG tablet Take 1 tablet by mouth every 6 (six) hours as needed for severe pain. 10/05/20 10/05/21  Tommi Rumps, PA-C  predniSONE (STERAPRED UNI-PAK 48 TAB) 10 MG (48) TBPK tablet Take as directed 10/05/20   Tommi Rumps, PA-C    Allergies Patient has no known allergies.  No family history on file.  Social History Social History   Tobacco Use  . Smoking status: Current Every Day Smoker    Packs/day: 0.50    Types: Cigarettes  . Smokeless tobacco: Never Used  Substance Use Topics  . Alcohol use: Yes    Comment: occasionally  . Drug use: No    Review of Systems Constitutional: No fever/chills Eyes: No visual changes. ENT: No sore throat. Cardiovascular: Denies chest pain. Respiratory: Denies shortness of breath. Gastrointestinal: No abdominal pain.   No nausea, no vomiting.  No diarrhea.  No constipation. Musculoskeletal: Positive cervical pain. Skin: Negative for rash. Neurological: Negative for headaches, focal weakness.  Positive radiculopathy down right upper extremity causing numbness to his right index finger. ____________________________________________   PHYSICAL EXAM:  VITAL SIGNS: ED Triage Vitals  Enc Vitals Group     BP 10/05/20 0935 (!) 159/108     Pulse Rate 10/05/20 0935 72     Resp 10/05/20 0935 16     Temp 10/05/20 0935 98.1 F (36.7 C)     Temp Source 10/05/20 0935 Oral     SpO2 10/05/20 0935 100 %     Weight 10/05/20 0931 220 lb (99.8 kg)     Height 10/05/20 0931 5\' 11"  (1.803 m)     Head Circumference --      Peak Flow --      Pain Score 10/05/20 0931 8     Pain Loc --      Pain Edu? --      Excl. in GC? --     Constitutional: Alert and oriented. Well appearing and in no acute distress. Eyes: Conjunctivae are normal.  Head: Atraumatic. Neck: No stridor.  Generalized tenderness on palpation of cervical spine without step-offs or soft tissue edema or discoloration.  There is moderate tenderness on palpation of the right cervical muscles into the right trapezius. Cardiovascular: Normal rate, regular rhythm. Grossly normal heart sounds.  Good peripheral circulation. Respiratory: Normal respiratory effort.  No retractions. Lungs CTAB. Gastrointestinal:  Soft and nontender. No distention.  Musculoskeletal: Nontender thoracic or lumbar spine.  Patient is able to ambulate without any assistance.  Good muscle strength lower extremities.  Upper extremities there is a slight difference in grip strength of the right hand versus the left.  Radial pulse is present.  Skin is intact.  Capillary refill is less than 3 seconds.  Subjective sensory to the index finger is decreased in comparison to the other digits on the right hand. Neurologic:  Normal speech and language.  Cranial nerves II through XII grossly intact.  No  gross focal neurologic deficits are appreciated.  Skin:  Skin is warm, dry and intact. No rash noted. Psychiatric: Mood and affect are normal. Speech and behavior are normal.  ____________________________________________   LABS (all labs ordered are listed, but only abnormal results are displayed)  Labs Reviewed - No data to display ____________________________________________  RADIOLOGY I, Tommi Rumps, personally viewed and evaluated these images (plain radiographs) as part of my medical decision making, as well as reviewing the written report by the radiologist.   Official radiology report(s): CT Cervical Spine Wo Contrast  Result Date: 10/05/2020 CLINICAL DATA:  Cervical radiculopathy. EXAM: CT CERVICAL SPINE WITHOUT CONTRAST TECHNIQUE: Multidetector CT imaging of the cervical spine was performed without intravenous contrast. Multiplanar CT image reconstructions were also generated. COMPARISON:  None. FINDINGS: Alignment: Normal. Skull base and vertebrae: No acute fracture. No primary bone lesion or focal pathologic process. Soft tissues and spinal canal: No prevertebral fluid or swelling. No visible canal hematoma. Disc levels: Mild degenerative disc disease is noted at C5-6. Moderate degenerative disc disease is noted at C6-7 with anterior and posterior osteophyte formation. Upper chest: Negative. Other: Severe degenerative change and hypertrophy is seen involving the right-sided posterior facet joint of C3-4. Mild right-sided neural foraminal stenosis is noted at C6-7 secondary to uncovertebral spurring. IMPRESSION: Multilevel degenerative changes as described above. No acute abnormality seen in the cervical spine. Electronically Signed   By: Lupita Raider M.D.   On: 10/05/2020 10:42    ____________________________________________   PROCEDURES  Procedure(s) performed (including Critical Care):  Procedures   ____________________________________________   INITIAL  IMPRESSION / ASSESSMENT AND PLAN / ED COURSE  As part of my medical decision making, I reviewed the following data within the electronic MEDICAL RECORD NUMBER Notes from prior ED visits and [place] Controlled Substance Database  30 year old male presents to the ED with complaint of cervical pain with radiation down to his right hand causing his right index finger to feel numb.  Patient states that his initial cervical pain began 2 months ago without history of injury.  He was seen in the ED which time he was started on prednisone which he states did not help.  CT scan today continues to show degenerative changes with mild right sided neuroforaminal stenosis at C6-C7.  There are also multi level degenerative changes noted.  Patient was given Toradol and Percocet while in the ED.  Patient prescription for Percocet and a steroid Dosepak for 2 weeks was sent to the pharmacy.  Called from [facility] stating that the medication was not there.  Medication was ordered through a telephone link and does not appear on his discharge papers.  He is given information about Dr. Myer Haff and strongly encouraged to call make an appointment for his office for evaluation of his cervical radiculopathy.  ____________________________________________   FINAL CLINICAL IMPRESSION(S) / ED DIAGNOSES  Final diagnoses:  Right cervical radiculopathy  Elevated blood pressure reading  ED Discharge Orders    None      *Please note:  James Rodriguez was evaluated in Emergency Department on 10/05/2020 for the symptoms described in the history of present illness. He was evaluated in the context of the global COVID-19 pandemic, which necessitated consideration that the patient might be at risk for infection with the SARS-CoV-2 virus that causes COVID-19. Institutional protocols and algorithms that pertain to the evaluation of patients at risk for COVID-19 are in a state of rapid change based on information released by regulatory bodies  including the CDC and federal and state organizations. These policies and algorithms were followed during the patient's care in the ED.  Some ED evaluations and interventions may be delayed as a result of limited staffing during and the pandemic.*   Note:  This document was prepared using Dragon voice recognition software and may include unintentional dictation errors.    Tommi Rumps, PA-C 10/05/20 1546    Concha Se, MD 10/06/20 [phone]

## 2020-10-05 NOTE — Discharge Instructions (Addendum)
Follow-up with Dr. Myer Haff who is the neurosurgeon on call.  His contact information and address is listed on your discharge papers.  The numbness in your fingers is caused by nerve involvement in your neck.  There is no medication that will make this completely go away.  A prescription for pain medication and steroids was sent to her pharmacy.  The steroids are to help reduce inflammation of that nerve which may give you temporary relief.  Do not drive or operate machinery while taking the pain medication as it could cause drowsiness and increase your risk for injury.  You may also use ice or heat to your neck and shoulder for comfort.  You also today had elevated blood pressure and this should be rechecked by your primary care provider.  Your initial blood pressure was 159/108.  This could be due to pain but also could be hypertension and should be treated.

## 2020-10-09 NOTE — Telephone Encounter (Cosign Needed)
Medication sent to pharmacy  

## 2021-09-11 ENCOUNTER — Encounter: Payer: Self-pay | Admitting: Emergency Medicine

## 2021-09-11 ENCOUNTER — Emergency Department: Payer: Self-pay

## 2021-09-11 ENCOUNTER — Emergency Department
Admission: EM | Admit: 2021-09-11 | Discharge: 2021-09-11 | Disposition: A | Discharge status: Home / Self Care | Payer: Self-pay | Attending: Emergency Medicine | Admitting: Emergency Medicine

## 2021-09-11 ENCOUNTER — Other Ambulatory Visit: Payer: Self-pay

## 2021-09-11 DIAGNOSIS — I10 Essential (primary) hypertension: Secondary | ICD-10-CM | POA: Insufficient documentation

## 2021-09-11 DIAGNOSIS — Z720 Tobacco use: Secondary | ICD-10-CM

## 2021-09-11 DIAGNOSIS — R079 Chest pain, unspecified: Secondary | ICD-10-CM | POA: Insufficient documentation

## 2021-09-11 DIAGNOSIS — M542 Cervicalgia: Secondary | ICD-10-CM | POA: Insufficient documentation

## 2021-09-11 DIAGNOSIS — Z87891 Personal history of nicotine dependence: Secondary | ICD-10-CM | POA: Insufficient documentation

## 2021-09-11 DIAGNOSIS — Z20822 Contact with and (suspected) exposure to covid-19: Secondary | ICD-10-CM | POA: Insufficient documentation

## 2021-09-11 LAB — BASIC METABOLIC PANEL
Anion gap: 8 (ref 5–15)
BUN: 8 mg/dL (ref 6–20)
CO2: 25 mmol/L (ref 22–32)
Calcium: 8.6 mg/dL — ABNORMAL LOW (ref 8.9–10.3)
Chloride: 106 mmol/L (ref 98–111)
Creatinine, Ser: 0.81 mg/dL (ref 0.61–1.24)
GFR, Estimated: >60 mL/min (ref >60)
Glucose, Bld: 109 mg/dL — ABNORMAL HIGH (ref 70–99)
Potassium: 4.4 mmol/L (ref 3.5–5.1)
Sodium: 139 mmol/L (ref 135–145)

## 2021-09-11 LAB — CBC
HCT: 49.1 % (ref 39.0–52.0)
Hemoglobin: 16.1 g/dL (ref 13.0–17.0)
MCH: 29.7 pg (ref 26.0–34.0)
MCHC: 32.8 g/dL (ref 30.0–36.0)
MCV: 90.6 fL (ref 80.0–100.0)
Platelets: 262 10*3/uL (ref 150–400)
RBC: 5.42 MIL/uL (ref 4.22–5.81)
RDW: 13.6 % (ref 11.5–15.5)
WBC: 9.1 10*3/uL (ref 4.0–10.5)
nRBC: 0 % (ref 0.0–0.2)

## 2021-09-11 LAB — RESP PANEL BY RT-PCR (FLU A&B, COVID) ARPGX2
Influenza A by PCR: NEGATIVE
Influenza B by PCR: NEGATIVE
SARS Coronavirus 2 by RT PCR: NEGATIVE

## 2021-09-11 LAB — D-DIMER, QUANTITATIVE: D-Dimer, Quant: <0.27 ug/mL-FEU (ref 0.00–0.50)

## 2021-09-11 LAB — TROPONIN I (HIGH SENSITIVITY): Troponin I (High Sensitivity): 6 ng/L (ref <18)

## 2021-09-11 MED ORDER — KETOROLAC TROMETHAMINE 30 MG/ML IJ SOLN
15.0000 mg | Freq: Once | INTRAMUSCULAR | Status: AC
  Administered 2021-09-11: 15 mg via INTRAVENOUS
  Filled 2021-09-11: qty 1

## 2021-09-11 MED ORDER — CYCLOBENZAPRINE HCL 10 MG PO TABS: 10.0000 mg | ORAL_TABLET | Freq: Three times a day (TID) | ORAL | 0 refills | Status: AC | PRN

## 2021-09-11 MED ORDER — ACETAMINOPHEN 500 MG PO TABS
1000.0000 mg | ORAL_TABLET | Freq: Once | ORAL | Status: AC
Start: 2021-09-11 — End: 2021-09-11
  Administered 2021-09-11: 1000 mg via ORAL
  Filled 2021-09-11: qty 2

## 2021-09-11 MED ORDER — CYCLOBENZAPRINE HCL 10 MG PO TABS
10.0000 mg | ORAL_TABLET | Freq: Once | ORAL | Status: AC
  Administered 2021-09-11: 10 mg via ORAL
  Filled 2021-09-11: qty 1

## 2021-09-11 MED ORDER — CYCLOBENZAPRINE HCL 10 MG PO TABS: 10.0000 mg | ORAL_TABLET | Freq: Three times a day (TID) | ORAL | 0 refills | Status: DC | PRN

## 2021-09-11 NOTE — ED Triage Notes (Signed)
Pt comes into the ED via POV c/o right chest pain and neck pain.  Pt states that he has been having neck and shoulder pain for several months, but thge chest pain is new.  Pt admits to [facility] and nausea but denies any dizziness.  Pt in NAD at this time with even and unlabored respirations.  ?

## 2021-09-11 NOTE — ED Notes (Signed)
D/C, new RX, and reasons to return to ED discussed with pt. Pt verbalized understanding. NAD noted.  ?

## 2021-09-11 NOTE — ED Provider Notes (Signed)
? ?[facility] ?Provider Note ? ? ? Event Date/Time  ? First MD Initiated Contact with Patient 09/11/21 [phone]   ?  (approximate) ? ? ?History  ? ?Chest Pain and Neck Pain ? ? ?HPI ? ?James Rodriguez is a 31 y.o. male with a past medical history of cervical radiculopathy, tobacco abuse with patient stating he last about 4 days ago who presents for assessment of 3 to 4 days of worsening right-sided chest discomfort.  He states it is worsened with deep breathing and minimal movement or palpation of his chest.  He does have some shortness of breath and is worse with deep breathing.  Endorses some chronic pain in the right side of his neck and shoulder does not seem much different than usual.  He has been feeling a little nauseous as well.  He has a slight cough which he attributes to his smoking that is not changed at all in the last couple days.  No hemoptysis, new back pain, abdominal pain, vomiting, diarrhea, urinary symptoms rash or extremity pain.  No fevers, headache and earache, sore throat or any other sick symptoms.  He has been taking Tylenol ibuprofen with last ibuprofen dose last night but no other analgesics. ? ?  ?History reviewed. No pertinent past medical history. ? ? ?Physical Exam  ?Triage Vital Signs: ?ED Triage Vitals [09/11/21 0757]  ?Enc Vitals Group  ?   BP   ?   Pulse   ?   Resp   ?   Temp   ?   Temp src   ?   SpO2   ?   Weight 220 lb 0.3 oz (99.8 kg)  ?   Height 5\' 11"  (1.803 m)  ?   Head Circumference   ?   Peak Flow   ?   Pain Score 7  ?   Pain Loc   ?   Pain Edu?   ?   Excl. in GC?   ? ? ?Most recent vital signs: ?Vitals:  ? 09/11/21 0800 09/11/21 0830  ?BP: (!) 149/99 (!) 139/93  ?Pulse: 93 64  ?Resp: 19 18  ?Temp: 98.2 ?F (36.8 ?C)   ?SpO2: 98% 99%  ? ? ?General: Awake, appears uncomfortable. ?CV:  Good peripheral perfusion.  2+ radial pulses. ?Resp:  Normal effort.  Clear bilaterally. ?Abd:  No distention.  Soft. ?Other:  Patient is fairly tender with reproducible  pain over the right chest without significant overlying skin changes. ? ? ?ED Results / Procedures / Treatments  ?Labs ?(all labs ordered are listed, but only abnormal results are displayed) ?Labs Reviewed  ?BASIC METABOLIC PANEL - Abnormal; Notable for the following components:  ?    Result Value  ? Glucose, Bld 109 (*)   ? Calcium 8.6 (*)   ? All other components within normal limits  ?RESP PANEL BY RT-PCR (FLU A&B, COVID) ARPGX2  ?CBC  ?D-DIMER, QUANTITATIVE  ?TROPONIN I (HIGH SENSITIVITY)  ? ? ? ?EKG ? ?EKG is remarkable for sinus rhythm with a ventricular rate of 88, normal axis, unremarkable intervals without evidence of acute ischemia or significant arrhythmia. ? ? ?RADIOLOGY ?Chest reviewed by myself shows no focal consoidation, effusion, edema, pneumothorax or other clear acute thoracic process. I also reviewed radiology interpretation and agree with findings described. ? ? ? ?PROCEDURES: ? ?Critical Care performed: No ? ?.1-3 Lead EKG Interpretation ?Performed by: 11/11/21, MD ?Authorized by: Gilles Chiquito, MD  ? ?  Interpretation: normal   ?  ECG rate assessment: normal   ?  Rhythm: sinus rhythm   ?  Ectopy: none   ?  Conduction: normal   ? ?The patient is on the cardiac monitor to evaluate for evidence of arrhythmia and/or significant heart rate changes. ? ? ?MEDICATIONS ORDERED IN ED: ?Medications  ?acetaminophen (TYLENOL) tablet 1,000 mg (has no administration in time range)  ?cyclobenzaprine (FLEXERIL) tablet 10 mg (has no administration in time range)  ?ketorolac (TORADOL) 30 MG/ML injection 15 mg (15 mg Intravenous Given 09/11/21 0816)  ? ? ? ?IMPRESSION / MDM / ASSESSMENT AND PLAN / ED COURSE  ?I reviewed the triage vital signs and the nursing notes. ?             ?               ? ?Differential diagnosis includes, but is not limited to ACS, PE, pneumothorax, pneumonia, bronchitis, symptomatic pleural effusion, anemia, pleurisy, myocarditis and dissection. ? ?EKG is remarkable for sinus  rhythm with a ventricular rate of 88, normal axis, unremarkable intervals without evidence of acute ischemia or significant arrhythmia.  Given nonelevated troponin obtained greater than 3 hours after symptom onset if a very low suspicion for ACS or myocarditis. ? ?CBC without leukocytosis or acute anemia.  BMP shows no significant electrolyte or metabolic derangements.  COVID influenza PCR is negative.  D-dimer is undetectable and thus have a low suspicion for PE.  Given pain is very reproducible overall description of symptoms have a low suspicion for dissection at this time and I am more suspicious for chest wall inflammation or some costochondritis.  He is feeling much better after some Toradol.  Counseled on tobacco cessation.  Will consider radiation and additional diagnostic studies given his feeling better with otherwise very reassuring work-up and low suspicion for immediately life-threatening pathology I think he is stable for discharge with outpatient follow-up.  Advised to have blood pressure rechecked as it was noted to be transiently elevated in emergency room.  Discharged in stable condition.  Strict return precautions advised and discussed. ? ?  ? ? ?FINAL CLINICAL IMPRESSION(S) / ED DIAGNOSES  ? ?Final diagnoses:  ?Chest pain, unspecified type  ?Tobacco abuse  ?Hypertension, unspecified type  ? ? ? ?Rx / DC Orders  ? ?ED Discharge Orders   ? ?      Ordered  ?  cyclobenzaprine (FLEXERIL) 10 MG tablet  3 times daily PRN       ? 09/11/21 8341  ? ?  ?  ? ?  ? ? ? ?Note:  This document was prepared using Dragon voice recognition software and may include unintentional dictation errors. ?  ?Gilles Chiquito, MD ?09/11/21 [phone] ? ?

## 2021-09-11 NOTE — Discharge Instructions (Signed)
Please have your blood pressure rechecked at your next PCP visit. ?

## 2021-09-11 NOTE — ED Notes (Signed)
Pt presents to ED with c/o of having R sided chest pain and neck pain. Pt states this has been ongoing for 3-4 days. Pt states chronic shoulder and neck pain but came in today due to new CP. Pt denies cardiac HX to this RN.  ? ?Pt does endorse sharp pain on inspiration. Pt's CP is reproducible at this time on palpitation. Pt is NAD. VSS. Pt denies SOB. Pt denies any episodes of diaphoresis and pt denies N/V/D to this RN. Pt is A&Ox4.  ?

## 2023-02-12 IMAGING — CR DG CERVICAL SPINE 2 OR 3 VIEWS
4 series · 4 of 4 positions shown · non-contrast
Comparison: 10/16/2014

CLINICAL DATA: Patent left upper extremity with radiculopathy. Neck
pain. Left hand swelling.

EXAM:
CERVICAL SPINE - 2-3 VIEW

[c-spine lat]
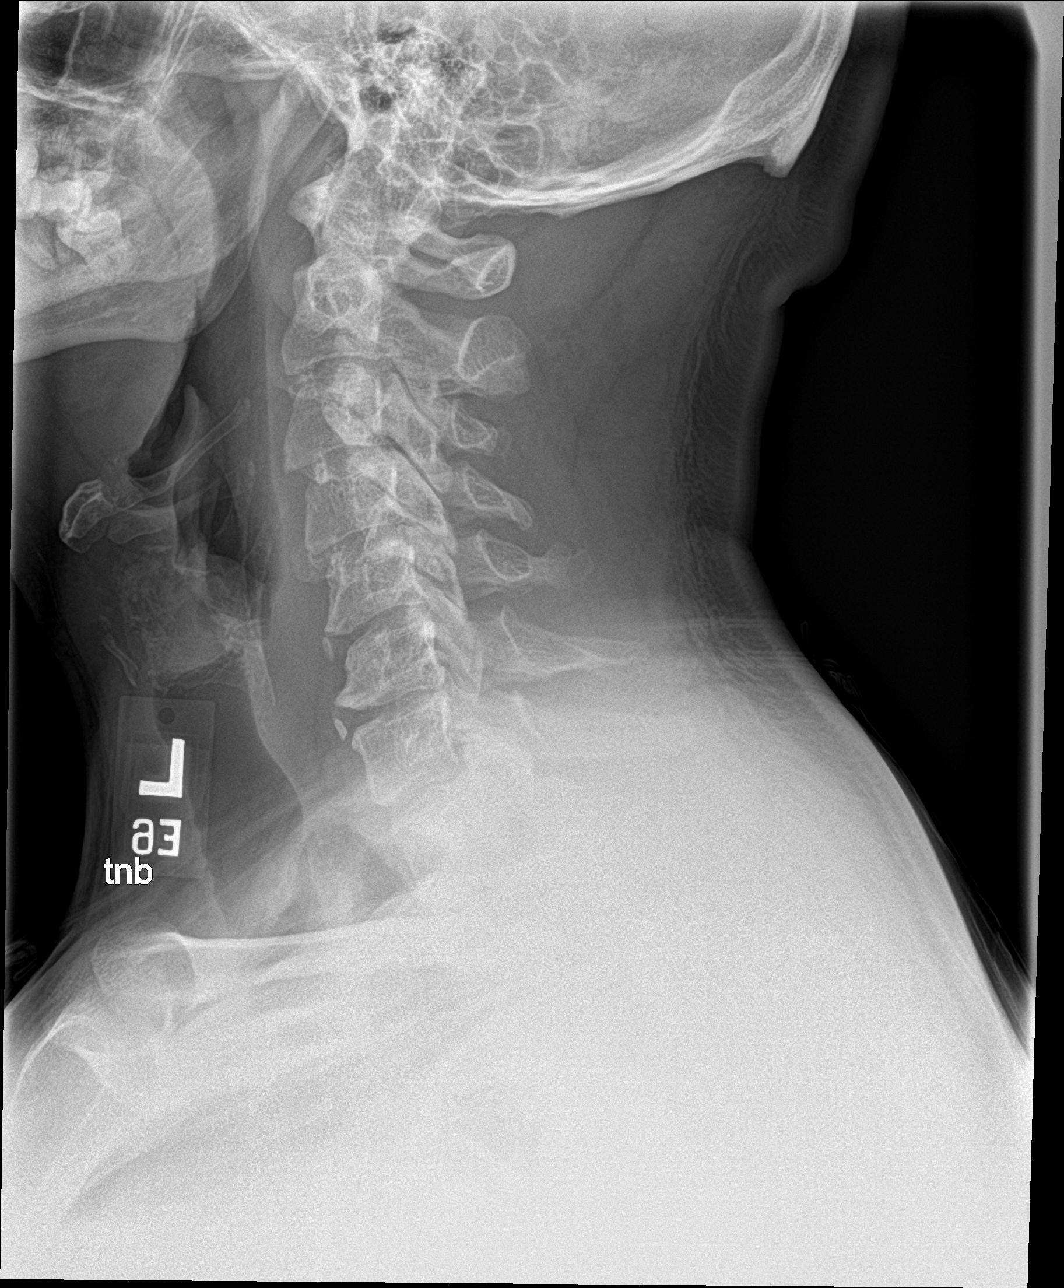

[c-spine ap]
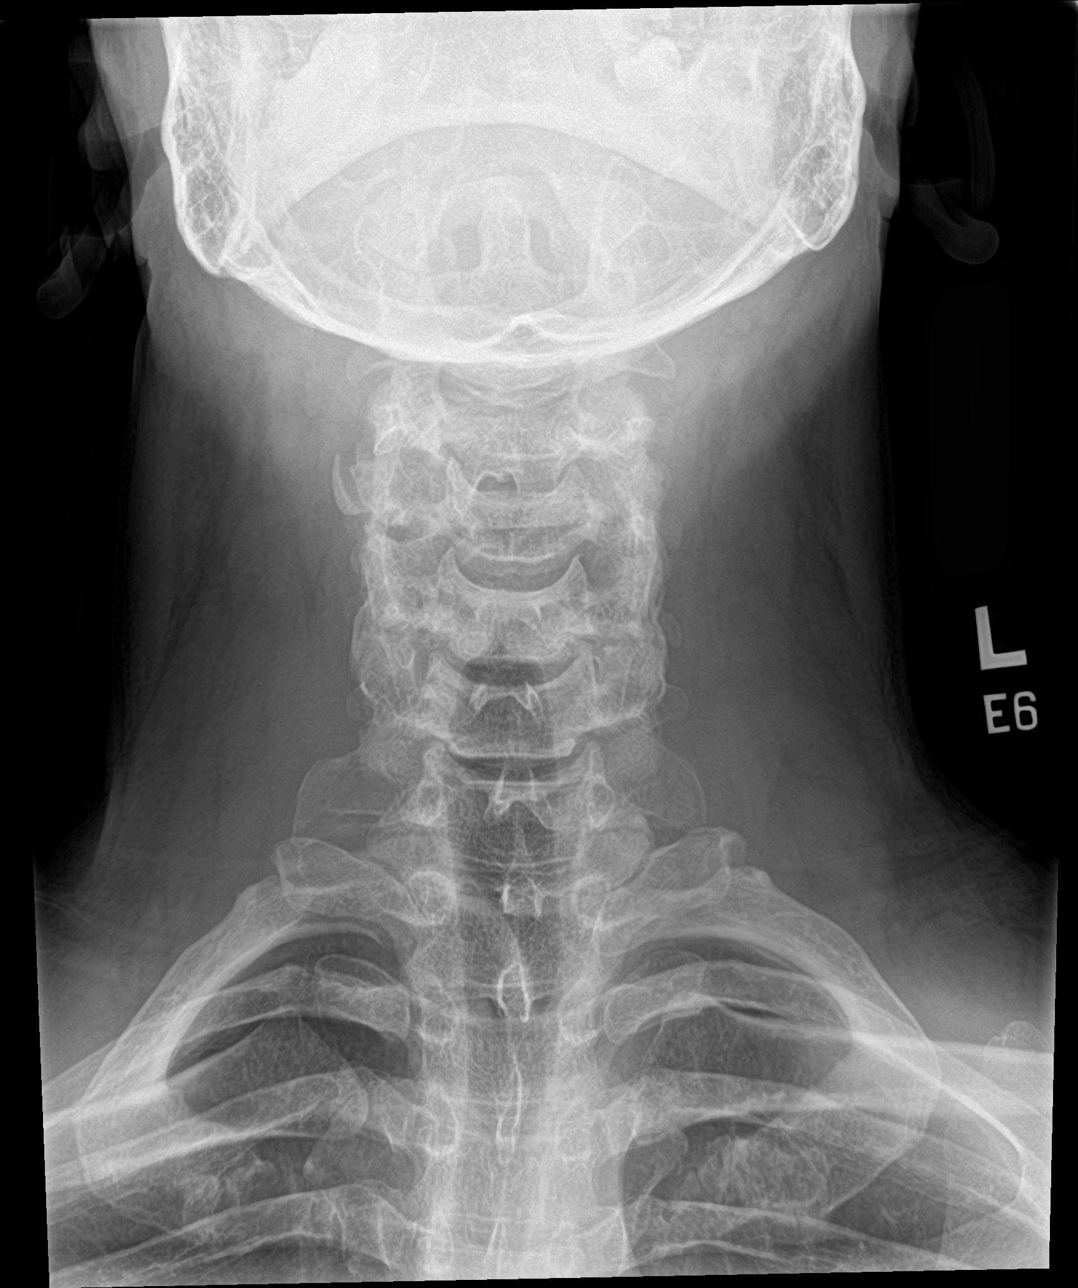

[c-spine open mouth]
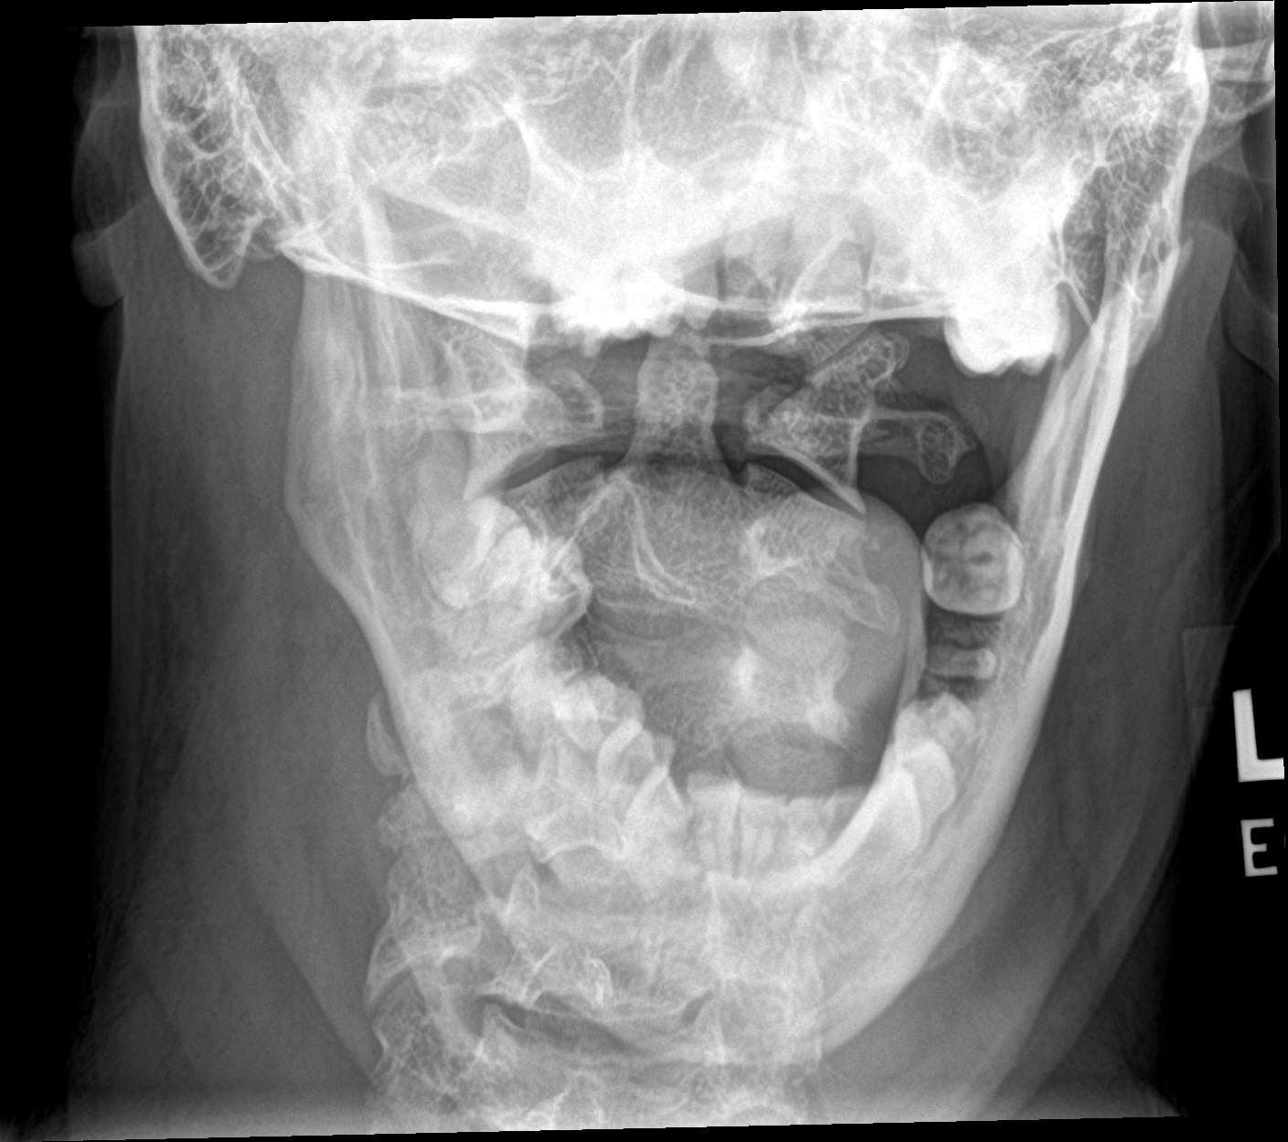

[c-spine swimmers]
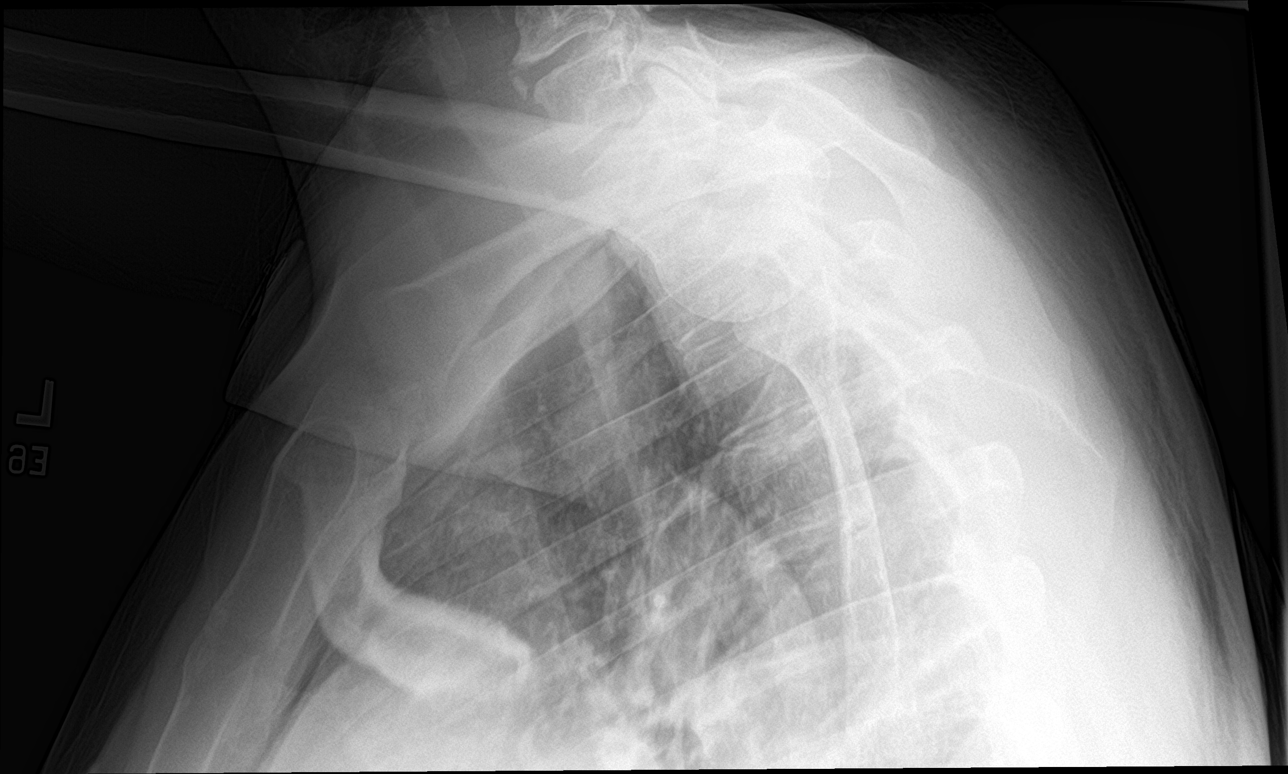

[4 of 4 positions shown; findings below may reference images not displayed]

FINDINGS: Grade 1 anterolisthesis of C3 on C4. Alignment otherwise within
normal limits. There is mild disc height loss and endplate spurring
at C4-C5, C5-C6, C6-C7. Facet degenerative changes seen throughout
the lower cervical spine. Visualized lung apices are clear.
Prevertebral soft tissues normal thickness.
IMPRESSION: Mild multilevel degenerative changes of the cervical spine which has
progressed since prior exam from [DATE].
